# Patient Record
Sex: Male | Born: 1995 | Race: White | Hispanic: No | Marital: Single | Smoking: Never smoker
Health system: Southern US, Community
[De-identification: ages and names within clinical notes are randomized; demographics above are authoritative.]

---
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
## 2020-02-08 ENCOUNTER — Ambulatory Visit
Admission: EM | Admit: 2020-02-08 | Discharge: 2020-02-08 | Disposition: A | Discharge status: Home / Self Care | Payer: Self-pay | Attending: Physician Assistant | Admitting: Physician Assistant

## 2020-02-08 ENCOUNTER — Other Ambulatory Visit: Payer: Self-pay

## 2020-02-08 DIAGNOSIS — R059 Cough, unspecified: Secondary | ICD-10-CM

## 2020-02-08 DIAGNOSIS — J3489 Other specified disorders of nose and nasal sinuses: Secondary | ICD-10-CM

## 2020-02-08 DIAGNOSIS — Z1152 Encounter for screening for COVID-19: Secondary | ICD-10-CM

## 2020-02-08 NOTE — ED Provider Notes (Signed)
[facility]    CSN: [id number] Arrival date & time: 02/08/20  1114      History   Chief Complaint Chief Complaint  Patient presents with   Cough    since yesterday   Sore Throat    since yesterday    HPI Anthony Leonard is a 24 y.o. male.   24 year old male comes in for 2 day of URI symptoms. Cough, rhinorrhea. States sore throat in the morning that improves throughout the day. Denies fever, chills, body aches. Denies abdominal pain, nausea, vomiting, diarrhea. Denies shortness of breath, loss of taste/smell. Needs COVID testing for work.      History reviewed. No pertinent past medical history.  There are no problems to display for this patient.   History reviewed. No pertinent surgical history.     Home Medications    Prior to Admission medications   Not on File    Family History Family History  Problem Relation Age of Onset   Healthy Mother    Healthy Father     Social History Social History   Tobacco Use   Smoking status: Never Smoker   Smokeless tobacco: Never Used  Building services engineer Use: Never used  Substance Use Topics   Alcohol use: Never   Drug use: Never     Allergies   Patient has no known allergies.   Review of Systems Review of Systems  Reason unable to perform ROS: See HPI as above.     Physical Exam Triage Vital Signs ED Triage Vitals  Enc Vitals Group     BP 02/08/20 1123 (!) 131/92     Pulse Rate 02/08/20 1123 (!) 101     Resp 02/08/20 1123 17     Temp 02/08/20 1123 98.2 F (36.8 C)     Temp Source 02/08/20 1123 Oral     SpO2 02/08/20 1123 98 %     Weight --      Height --      Head Circumference --      Peak Flow --      Pain Score 02/08/20 1124 2     Pain Loc --      Pain Edu? --      Excl. in GC? --    No data found.  Updated Vital Signs BP (!) 131/92 (BP Location: Right Arm)    Pulse (!) 101    Temp 98.2 F (36.8 C) (Oral)    Resp 17    SpO2 98%   Physical  Exam Constitutional:      General: He is not in acute distress.    Appearance: Normal appearance. He is not ill-appearing, toxic-appearing or diaphoretic.  HENT:     Head: Normocephalic and atraumatic.     Mouth/Throat:     Mouth: Mucous membranes are moist.     Pharynx: Oropharynx is clear. Uvula midline.     Tonsils: No tonsillar exudate.  Cardiovascular:     Rate and Rhythm: Normal rate and regular rhythm.     Heart sounds: Normal heart sounds. No murmur heard.  No friction rub. No gallop.   Pulmonary:     Effort: Pulmonary effort is normal. No accessory muscle usage, prolonged expiration, respiratory distress or retractions.     Comments: Lungs clear to auscultation without adventitious lung sounds. Musculoskeletal:     Cervical back: Normal range of motion and neck supple.  Neurological:     General: No focal deficit present.  Mental Status: He is alert and oriented to person, place, and time.      UC Treatments / Results  Labs (all labs ordered are listed, but only abnormal results are displayed) Labs Reviewed  NOVEL CORONAVIRUS, NAA    EKG   Radiology No results found.  Procedures Procedures (including critical care time)  Medications Ordered in UC Medications - No data to display  Initial Impression / Assessment and Plan / UC Course  I have reviewed the triage vital signs and the nursing notes.  Pertinent labs & imaging results that were available during my care of the patient were reviewed by me and considered in my medical decision making (see chart for details).    COVID PCR test ordered. Patient to quarantine until testing results return. No alarming signs on exam. LCTAB. Symptomatic treatment discussed.  Push fluids.  Return precautions given.  Patient expresses understanding and agrees to plan.  Final Clinical Impressions(s) / UC Diagnoses   Final diagnoses:  Encounter for screening for COVID-19  Rhinorrhea  Cough    ED Prescriptions     None     PDMP not reviewed this encounter.   Belinda Fisher, PA-C 02/08/20 1338

## 2020-02-08 NOTE — ED Triage Notes (Signed)
Pt states cough and runny nose since yesterday. Pt requests a covid test for work. Pt ao x 4 and ambulatory.

## 2020-02-08 NOTE — Discharge Instructions (Signed)
COVID PCR testing ordered. I would like you to quarantine until testing results. You can take over the counter flonase/nasacort to help with nasal congestion/drainage. Tylenol/motrin for pain and fever. Keep hydrated, urine should be clear to pale yellow in color. If experiencing shortness of breath, trouble breathing, go to the emergency department for further evaluation needed.  

## 2020-02-10 LAB — NOVEL CORONAVIRUS, NAA: SARS-CoV-2, NAA: DETECTED — AB

## 2020-02-10 LAB — SARS-COV-2, NAA 2 DAY TAT
